# Patient Record
Sex: Male | Born: 1995 | Race: White | Hispanic: No | Marital: Single | Smoking: Never smoker
Health system: Southern US, Community
[De-identification: ages and names within clinical notes are randomized; demographics above are authoritative.]

---

## 2016-01-18 ENCOUNTER — Emergency Department (HOSPITAL_BASED_OUTPATIENT_CLINIC_OR_DEPARTMENT_OTHER)
Admission: EM | Admit: 2016-01-18 | Discharge: 2016-01-18 | Disposition: A | Discharge status: Home / Self Care | Payer: BLUE CROSS/BLUE SHIELD | Attending: Emergency Medicine | Admitting: Emergency Medicine

## 2016-01-18 ENCOUNTER — Encounter (HOSPITAL_BASED_OUTPATIENT_CLINIC_OR_DEPARTMENT_OTHER): Payer: Self-pay

## 2016-01-18 DIAGNOSIS — R350 Frequency of micturition: Secondary | ICD-10-CM | POA: Diagnosis not present

## 2016-01-18 DIAGNOSIS — R300 Dysuria: Secondary | ICD-10-CM | POA: Diagnosis present

## 2016-01-18 LAB — URINALYSIS, ROUTINE W REFLEX MICROSCOPIC
Bilirubin Urine: NEGATIVE
GLUCOSE, UA: NEGATIVE mg/dL
HGB URINE DIPSTICK: NEGATIVE
KETONES UR: NEGATIVE mg/dL
Leukocytes, UA: NEGATIVE
Nitrite: NEGATIVE
PROTEIN: NEGATIVE mg/dL
Specific Gravity, Urine: 1.011 (ref 1.005–1.030)
pH: 7.5 (ref 5.0–8.0)

## 2016-01-18 NOTE — Discharge Instructions (Signed)
Your STD tests will result tomorrow.  We will call you if anything is abnormal. Recommend to increase your free water intake.  Limit caffeine-- this is a diuretic and will make you urinate more. Follow-up with urology if you continue having issues with urination.

## 2016-01-18 NOTE — ED Triage Notes (Signed)
Pt c/o burning on urination, pain in testicles

## 2016-01-18 NOTE — ED Provider Notes (Signed)
[facility] Note   CSN: [id number] Arrival date & time: 01/18/16  2129  By signing my name below, I, [name], attest that this documentation has been prepared under the direction and in the presence of Sharilyn SitesLisa Roiza Wiedel, PA-C. Electronically Signed: Angelene GiovanniEmmanuella Mensah, ED Scribe. 01/18/16. 10:12 PM.   History   Chief Complaint Chief Complaint  Patient presents with  . Other    burning on urination    HPI Comments: Jonathan Daugherty is a 20 y.o. male who presents to the Emergency Department complaining of gradually worsening mild pain dysuria. He reports associated mild suprapubic pain and burning with urination and a sensation that "I cannot stop peeing". Does report he drinks a lot of caffeine and not much water. No alleviating factors noted. Pt has not tried any medications PTA. He reports that he is sexually active with 2 partners using constant protection for intercourse. He denies a hx of STDs. He states that these symptoms are consistent with his hx of UTI one year ago. He denies any penile discharge, penile pain, scrotal swelling, testicle pain, hematuria, fever, chills, nausea, vomiting, or any other symptoms.   [facility]   The history is provided by the patient. No language interpreter was used.    History reviewed. No pertinent past medical history.  There are no active problems to display for this patient.   History reviewed. No pertinent surgical history.     Home Medications    Prior to Admission medications   Not on File    Family History No family history on file.  Social History Social History  Substance Use Topics  . Smoking status: Never Smoker  . Smokeless tobacco: Never Used  . Alcohol use No     Allergies   Augmentin [amoxicillin-pot clavulanate]   Review of Systems Review of Systems  Constitutional: Negative for chills and fever.  Gastrointestinal: Negative for abdominal pain, nausea and vomiting.    Genitourinary: Positive for dysuria (burning ). Negative for discharge, hematuria, penile pain, scrotal swelling and testicular pain.  All other systems reviewed and are negative.    Physical Exam Updated Vital Signs BP 142/83 (BP Location: Left Arm)   Pulse 89   Temp 98.1 F (36.7 C) (Oral)   Resp 18   Ht 5\' 8"  (1.727 m)   Wt 150 lb (68 kg)   SpO2 99%   BMI 22.81 kg/m   Physical Exam  Constitutional: He is oriented to person, place, and time. He appears well-developed and well-nourished.  HENT:  Head: Normocephalic and atraumatic.  Mouth/Throat: Oropharynx is clear and moist.  Eyes: Conjunctivae and EOM are normal. Pupils are equal, round, and reactive to light.  Neck: Normal range of motion.  Cardiovascular: Normal rate, regular rhythm and normal heart sounds.   Pulmonary/Chest: Effort normal and breath sounds normal.  Abdominal: Soft. Bowel sounds are normal.  Genitourinary: Testes normal. Circumcised. No hypospadias.  Genitourinary Comments: Circumcised male penis, meatus in correct position, however appears to have a small strip of tissue across the middle portion of opening given it a septate appearance; no discharge or swelling noted; no bleeding Testicles normal, non-tender, no swelling or masses, normal lie  Musculoskeletal: Normal range of motion.  Neurological: He is alert and oriented to person, place, and time.  Skin: Skin is warm and dry.  Psychiatric: He has a normal mood and affect.  Nursing note and vitals reviewed.    ED Treatments / Results  DIAGNOSTIC STUDIES: Oxygen Saturation is  99% on RA, normal by my interpretation.    COORDINATION OF CARE: 10:07 PM- Pt advised of plan for treatment and pt agrees. Pt will receive UA for further evaluation.    Labs (all labs ordered are listed, but only abnormal results are displayed) Labs Reviewed  URINALYSIS, ROUTINE W REFLEX MICROSCOPIC (NOT AT [facility])  HIV ANTIBODY (ROUTINE TESTING)  RPR  GC/CHLAMYDIA  PROBE AMP ([place]) NOT AT [facility]    EKG  EKG Interpretation None       Radiology No results found.  Procedures Procedures (including critical care time)  Medications Ordered in ED Medications - No data to display   Initial Impression / Assessment and Plan / ED Course  Sharilyn SitesLisa Kateri Balch, PA-C has reviewed the triage vital signs and the nursing notes.  Pertinent labs & imaging results that were available during my care of the patient were reviewed by me and considered in my medical decision making (see chart for details).  Clinical Course    20 year old male here with dysuria and urinary frequency. He is sexually active, but with protection. He is afebrile and nontoxic. His abdomen is soft and benign. No CVA tenderness. GU exam somewhat abnormal as urethral meatus has somewhat of a septate appearance. There is no discharge or swelling. Testicles are normal and nontender. UA without any signs of infection.  Gc/chl, HIV, RPR pending.  Patient encouraged to increase his free water intake and limit caffeine as this is a known diuretic.  Will have him follow-up with urology if symptoms persist.  Discussed plan with patient, he acknowledged understanding and agreed with plan of care.  Return precautions given for new or worsening symptoms.  Final Clinical Impressions(s) / ED Diagnoses   Final diagnoses:  Dysuria  Urinary frequency    New Prescriptions There are no discharge medications for this patient.  I personally performed the services described in this documentation, which was scribed in my presence. The recorded information has been reviewed and is accurate.   Garlon HatchetLisa M Terryn Rosenkranz, PA-C 01/19/16 0001    Rolan BuccoMelanie Belfi, MD 01/21/16 1501

## 2016-01-18 NOTE — ED Notes (Signed)
ED Provider at bedside. 

## 2016-01-19 LAB — GC/CHLAMYDIA PROBE AMP (~~LOC~~) NOT AT ARMC
Chlamydia: NEGATIVE
Neisseria Gonorrhea: NEGATIVE

## 2016-01-20 LAB — RPR: RPR Ser Ql: NONREACTIVE

## 2016-01-20 LAB — HIV ANTIBODY (ROUTINE TESTING W REFLEX): HIV Screen 4th Generation wRfx: NONREACTIVE

## 2017-07-01 ENCOUNTER — Other Ambulatory Visit: Payer: Self-pay

## 2017-07-01 ENCOUNTER — Emergency Department (HOSPITAL_BASED_OUTPATIENT_CLINIC_OR_DEPARTMENT_OTHER)
Admission: EM | Admit: 2017-07-01 | Discharge: 2017-07-02 | Disposition: A | Discharge status: Home / Self Care | Payer: BLUE CROSS/BLUE SHIELD | Attending: Emergency Medicine | Admitting: Emergency Medicine

## 2017-07-01 ENCOUNTER — Encounter (HOSPITAL_BASED_OUTPATIENT_CLINIC_OR_DEPARTMENT_OTHER): Payer: Self-pay | Admitting: *Deleted

## 2017-07-01 DIAGNOSIS — N5082 Scrotal pain: Secondary | ICD-10-CM | POA: Insufficient documentation

## 2017-07-01 DIAGNOSIS — N50819 Testicular pain, unspecified: Secondary | ICD-10-CM

## 2017-07-01 LAB — URINALYSIS, ROUTINE W REFLEX MICROSCOPIC
Bilirubin Urine: NEGATIVE
Glucose, UA: NEGATIVE mg/dL
Hgb urine dipstick: NEGATIVE
Ketones, ur: NEGATIVE mg/dL
LEUKOCYTES UA: NEGATIVE
NITRITE: NEGATIVE
PH: 6.5 (ref 5.0–8.0)
PROTEIN: NEGATIVE mg/dL
Specific Gravity, Urine: 1.015 (ref 1.005–1.030)

## 2017-07-01 MED ORDER — NAPROXEN 250 MG PO TABS
500.0000 mg | ORAL_TABLET | Freq: Once | ORAL | Status: AC
  Administered 2017-07-01: 500 mg via ORAL
  Filled 2017-07-01: qty 2

## 2017-07-01 MED ORDER — NAPROXEN 500 MG PO TABS: 500.0000 mg | ORAL_TABLET | Freq: Two times a day (BID) | ORAL | 0 refills | Status: AC

## 2017-07-01 NOTE — ED Notes (Signed)
ED Provider at bedside. 
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
## 2017-07-01 NOTE — Discharge Instructions (Addendum)
You were seen today for testicle pain.  Your exam is normal.  Return tomorrow for ultrasound imaging.  Wear good fitting underwear that is supportive.  Take naproxen as needed for pain.

## 2017-07-01 NOTE — ED Notes (Signed)
Spoke with Dr. Ethelda Chick regarding pt. Orders given.
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
## 2017-07-02 ENCOUNTER — Ambulatory Visit (HOSPITAL_BASED_OUTPATIENT_CLINIC_OR_DEPARTMENT_OTHER)
Admit: 2017-07-02 | Discharge: 2017-07-02 | Disposition: A | Discharge status: Home / Self Care | Payer: BLUE CROSS/BLUE SHIELD | Attending: Emergency Medicine | Admitting: Emergency Medicine

## 2017-07-02 DIAGNOSIS — N50819 Testicular pain, unspecified: Secondary | ICD-10-CM
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
